# Patient Record
Sex: Female | Born: 1935 | Race: White | Hispanic: No | Marital: Married | Smoking: Never smoker
Health system: Southern US, Community
[De-identification: ages and names within clinical notes are randomized; demographics above are authoritative.]

## PROBLEM LIST (undated history)

## (undated) DIAGNOSIS — E079 Disorder of thyroid, unspecified: Secondary | ICD-10-CM

## (undated) DIAGNOSIS — K219 Gastro-esophageal reflux disease without esophagitis: Secondary | ICD-10-CM

## (undated) DIAGNOSIS — E785 Hyperlipidemia, unspecified: Secondary | ICD-10-CM

## (undated) DIAGNOSIS — I10 Essential (primary) hypertension: Secondary | ICD-10-CM

## (undated) DIAGNOSIS — I2510 Atherosclerotic heart disease of native coronary artery without angina pectoris: Secondary | ICD-10-CM

## (undated) DIAGNOSIS — D649 Anemia, unspecified: Secondary | ICD-10-CM

## (undated) HISTORY — PX: ABDOMINAL HYSTERECTOMY: SHX81

## (undated) HISTORY — PX: TONSILLECTOMY: SUR1361

---

## 2009-01-27 ENCOUNTER — Emergency Department (HOSPITAL_BASED_OUTPATIENT_CLINIC_OR_DEPARTMENT_OTHER): Admission: EM | Admit: 2009-01-27 | Discharge: 2009-01-27 | Payer: Self-pay | Admitting: Emergency Medicine

## 2009-01-27 ENCOUNTER — Ambulatory Visit: Payer: Self-pay | Admitting: Interventional Radiology

## 2010-11-24 LAB — BASIC METABOLIC PANEL
CO2: 28 mEq/L (ref 19–32)
Calcium: 9.5 mg/dL (ref 8.4–10.5)
Creatinine, Ser: 0.7 mg/dL (ref 0.4–1.2)
Glucose, Bld: 115 mg/dL — ABNORMAL HIGH (ref 70–99)

## 2010-11-24 LAB — CBC
Hemoglobin: 12.7 g/dL (ref 12.0–15.0)
MCHC: 34.1 g/dL (ref 30.0–36.0)
MCV: 96.6 fL (ref 78.0–100.0)
RDW: 12.8 % (ref 11.5–15.5)

## 2010-11-24 LAB — DIFFERENTIAL
Basophils Absolute: 0 10*3/uL (ref 0.0–0.1)
Basophils Relative: 0 % (ref 0–1)
Eosinophils Absolute: 0.1 10*3/uL (ref 0.0–0.7)
Monocytes Absolute: 0.3 10*3/uL (ref 0.1–1.0)
Neutro Abs: 4.4 10*3/uL (ref 1.7–7.7)
Neutrophils Relative %: 71 % (ref 43–77)

## 2010-11-24 LAB — POCT CARDIAC MARKERS: CKMB, poc: 1.3 ng/mL (ref 1.0–8.0)

## 2015-03-15 ENCOUNTER — Encounter (HOSPITAL_BASED_OUTPATIENT_CLINIC_OR_DEPARTMENT_OTHER): Payer: Self-pay

## 2015-03-15 ENCOUNTER — Emergency Department (HOSPITAL_BASED_OUTPATIENT_CLINIC_OR_DEPARTMENT_OTHER): Payer: Medicare Other

## 2015-03-15 ENCOUNTER — Emergency Department (HOSPITAL_BASED_OUTPATIENT_CLINIC_OR_DEPARTMENT_OTHER)
Admission: EM | Admit: 2015-03-15 | Discharge: 2015-03-15 | Disposition: A | Discharge status: Home / Self Care | Payer: Medicare Other | Attending: Emergency Medicine | Admitting: Emergency Medicine

## 2015-03-15 DIAGNOSIS — R079 Chest pain, unspecified: Secondary | ICD-10-CM | POA: Diagnosis present

## 2015-03-15 DIAGNOSIS — M549 Dorsalgia, unspecified: Secondary | ICD-10-CM | POA: Diagnosis not present

## 2015-03-15 DIAGNOSIS — Z90710 Acquired absence of both cervix and uterus: Secondary | ICD-10-CM | POA: Insufficient documentation

## 2015-03-15 DIAGNOSIS — Z79899 Other long term (current) drug therapy: Secondary | ICD-10-CM | POA: Diagnosis not present

## 2015-03-15 DIAGNOSIS — R0789 Other chest pain: Secondary | ICD-10-CM | POA: Insufficient documentation

## 2015-03-15 DIAGNOSIS — I10 Essential (primary) hypertension: Secondary | ICD-10-CM

## 2015-03-15 DIAGNOSIS — I2510 Atherosclerotic heart disease of native coronary artery without angina pectoris: Secondary | ICD-10-CM | POA: Diagnosis not present

## 2015-03-15 DIAGNOSIS — R1013 Epigastric pain: Secondary | ICD-10-CM | POA: Diagnosis not present

## 2015-03-15 DIAGNOSIS — E039 Hypothyroidism, unspecified: Secondary | ICD-10-CM | POA: Diagnosis not present

## 2015-03-15 DIAGNOSIS — E785 Hyperlipidemia, unspecified: Secondary | ICD-10-CM | POA: Insufficient documentation

## 2015-03-15 DIAGNOSIS — R1011 Right upper quadrant pain: Secondary | ICD-10-CM | POA: Diagnosis not present

## 2015-03-15 HISTORY — DX: Disorder of thyroid, unspecified: E07.9

## 2015-03-15 HISTORY — DX: Hyperlipidemia, unspecified: E78.5

## 2015-03-15 HISTORY — DX: Essential (primary) hypertension: I10

## 2015-03-15 HISTORY — DX: Atherosclerotic heart disease of native coronary artery without angina pectoris: I25.10

## 2015-03-15 LAB — CBC WITH DIFFERENTIAL/PLATELET
BASOS ABS: 0 10*3/uL (ref 0.0–0.1)
Basophils Relative: 0 % (ref 0–1)
EOS ABS: 0.2 10*3/uL (ref 0.0–0.7)
Eosinophils Relative: 4 % (ref 0–5)
HEMATOCRIT: 37.9 % (ref 36.0–46.0)
Hemoglobin: 12.4 g/dL (ref 12.0–15.0)
LYMPHS ABS: 1.6 10*3/uL (ref 0.7–4.0)
Lymphocytes Relative: 26 % (ref 12–46)
MCH: 32.9 pg (ref 26.0–34.0)
MCHC: 32.7 g/dL (ref 30.0–36.0)
MCV: 100.5 fL — ABNORMAL HIGH (ref 78.0–100.0)
MONO ABS: 0.5 10*3/uL (ref 0.1–1.0)
MONOS PCT: 8 % (ref 3–12)
NEUTROS ABS: 4 10*3/uL (ref 1.7–7.7)
Neutrophils Relative %: 62 % (ref 43–77)
PLATELETS: 270 10*3/uL (ref 150–400)
RBC: 3.77 MIL/uL — AB (ref 3.87–5.11)
RDW: 13.2 % (ref 11.5–15.5)
WBC: 6.3 10*3/uL (ref 4.0–10.5)

## 2015-03-15 LAB — URINALYSIS, ROUTINE W REFLEX MICROSCOPIC
Bilirubin Urine: NEGATIVE
GLUCOSE, UA: NEGATIVE mg/dL
KETONES UR: NEGATIVE mg/dL
NITRITE: NEGATIVE
Protein, ur: NEGATIVE mg/dL
Specific Gravity, Urine: 1.004 — ABNORMAL LOW (ref 1.005–1.030)
UROBILINOGEN UA: 0.2 mg/dL (ref 0.0–1.0)
pH: 6.5 (ref 5.0–8.0)

## 2015-03-15 LAB — TROPONIN I: Troponin I: <0.03 ng/mL (ref <0.031)

## 2015-03-15 LAB — COMPREHENSIVE METABOLIC PANEL
ALBUMIN: 3.9 g/dL (ref 3.5–5.0)
ALT: 25 U/L (ref 14–54)
AST: 40 U/L (ref 15–41)
Alkaline Phosphatase: 122 U/L (ref 38–126)
Anion gap: 5 (ref 5–15)
BUN: 15 mg/dL (ref 6–20)
CALCIUM: 9 mg/dL (ref 8.9–10.3)
CHLORIDE: 106 mmol/L (ref 101–111)
CO2: 26 mmol/L (ref 22–32)
Creatinine, Ser: 0.61 mg/dL (ref 0.44–1.00)
GFR calc Af Amer: >60 mL/min (ref >60)
GFR calc non Af Amer: >60 mL/min (ref >60)
Glucose, Bld: 149 mg/dL — ABNORMAL HIGH (ref 65–99)
Potassium: 4 mmol/L (ref 3.5–5.1)
Sodium: 137 mmol/L (ref 135–145)
TOTAL PROTEIN: 7.9 g/dL (ref 6.5–8.1)
Total Bilirubin: 1.1 mg/dL (ref 0.3–1.2)

## 2015-03-15 LAB — LIPASE, BLOOD: LIPASE: 19 U/L — AB (ref 22–51)

## 2015-03-15 LAB — URINE MICROSCOPIC-ADD ON

## 2015-03-15 MED ORDER — IOHEXOL 350 MG/ML SOLN
100.0000 mL | Freq: Once | INTRAVENOUS | Status: AC | PRN
  Administered 2015-03-15: 100 mL via INTRAVENOUS

## 2015-03-15 NOTE — ED Notes (Signed)
PT reports "stomach pain" but points to her chest, epigastric area, with radiation to back. Reports pain woke her up this morning.

## 2015-03-15 NOTE — ED Provider Notes (Addendum)
CSN: [id number]     Arrival date & time 03/15/15  0901 History   First MD Initiated Contact with Patient 03/15/15 [phone]     Chief Complaint  Patient presents with  . Chest Pain     (Consider location/radiation/quality/duration/timing/severity/associated sxs/prior Treatment) The history is provided by the patient.  Casey Vazquez is a 81 y.o. female hx of HTN, HL, nonobstructive CAD here with chest pain, back pain, ab pain.  She woke up this morning with sudden onset sharp chest pain radiating to her abdomen and back.  Lasted for about an hour and then subsided.  Denies any shortness of breath.  She took her morning blood pressure medicine and then came to the ER.  Denies any fevers or chills or cough.  States that she has history of GERD and this is different.  Patient had 2 angiograms in the past but had no cardiac stents.    Past Medical History  Diagnosis Date  . Hypertension   . Hyperlipidemia   . Coronary artery disease   . Thyroid disease    Past Surgical History  Procedure Laterality Date  . Abdominal hysterectomy    . Tonsillectomy     History reviewed. No pertinent family history. History  Substance Use Topics  . Smoking status: Never Smoker   . Smokeless tobacco: Not on file  . Alcohol Use: No   OB History    No data available     Review of Systems  Cardiovascular: Positive for chest pain.  Gastrointestinal: Positive for abdominal pain.  All other systems reviewed and are negative.     Allergies  Ace inhibitors; Ampicillin; Benicar; Codeine; Evista; Levaquin; and Sulfa antibiotics  Home Medications   Prior to Admission medications   Medication Sig Start Date End Date Taking? Authorizing Provider  carvedilol (COREG) 3.125 MG tablet Take 3.125 mg by mouth 2 (two) times daily with a meal.   Yes Historical Provider, MD  cloNIDine (CATAPRES) 0.2 MG tablet Take 0.2 mg by mouth 2 (two) times daily.   Yes Historical Provider, MD  ezetimibe (ZETIA) 10 MG tablet  Take 10 mg by mouth daily.   Yes Historical Provider, MD  levothyroxine (SYNTHROID, LEVOTHROID) 88 MCG tablet Take 88 mcg by mouth daily before breakfast.   Yes Historical Provider, MD  pantoprazole (PROTONIX) 40 MG tablet Take 40 mg by mouth daily.   Yes Historical Provider, MD   BP 128/54 mmHg  Pulse 55  Temp(Src) 98.5 F (36.9 C) (Oral)  Resp 13  Ht 5\' 3"  (1.6 m)  Wt 115 lb (52.164 kg)  BMI 20.38 kg/m2  SpO2 94% Physical Exam  Constitutional: She is oriented to person, place, and time.  Uncomfortable   HENT:  Head: Normocephalic.  Mouth/Throat: Oropharynx is clear and moist.  Eyes: Conjunctivae are normal. Pupils are equal, round, and reactive to light.  Neck: Normal range of motion. Neck supple.  Cardiovascular: Normal rate, regular rhythm and normal heart sounds.   Pulmonary/Chest: Effort normal and breath sounds normal. No respiratory distress. She has no wheezes. She has no rales.  Abdominal: Soft. Bowel sounds are normal.  + epigastric tenderness and RUQ tenderness   Musculoskeletal: Normal range of motion.  Neurological: She is alert and oriented to person, place, and time.  Skin: Skin is warm and dry.  Psychiatric: She has a normal mood and affect. Her behavior is normal. Judgment and thought content normal.  Nursing note and vitals reviewed.   ED Course  Procedures (including critical care time)  Labs Review Labs Reviewed  CBC WITH DIFFERENTIAL/PLATELET - Abnormal; Notable for the following:    RBC 3.77 (*)    MCV 100.5 (*)    All other components within normal limits  COMPREHENSIVE METABOLIC PANEL - Abnormal; Notable for the following:    Glucose, Bld 149 (*)    All other components within normal limits  LIPASE, BLOOD - Abnormal; Notable for the following:    Lipase 19 (*)    All other components within normal limits  URINALYSIS, ROUTINE W REFLEX MICROSCOPIC (NOT AT [facility]) - Abnormal; Notable for the following:    Specific Gravity, Urine 1.004 (*)    Hgb  urine dipstick TRACE (*)    Leukocytes, UA SMALL (*)    All other components within normal limits  URINE MICROSCOPIC-ADD ON - Abnormal; Notable for the following:    Bacteria, UA FEW (*)    All other components within normal limits  TROPONIN I  TROPONIN I    Imaging Review Ct Angio Chest Aorta W/cm &/or Wo/cm  03/15/2015   CLINICAL DATA:  81 year old female with sudden onset chest back and abdominal pain earlier this morning which has since subsided. Evaluate for aortic dissection.  EXAM: CT ANGIOGRAPHY CHEST, ABDOMEN AND PELVIS  TECHNIQUE: Multidetector CT imaging through the chest, abdomen and pelvis was performed using the standard protocol during bolus administration of intravenous contrast. Multiplanar reconstructed images and MIPs were obtained and reviewed to evaluate the vascular anatomy.  CONTRAST:  OMNIPAQUE IOHEXOL 350 MG/ML SOLN  COMPARISON:  Chest x-ray 01/27/2009  FINDINGS: CTA CHEST FINDINGS  Mediastinum: Unremarkable CT appearance of the thyroid gland. No suspicious mediastinal or hilar adenopathy. No soft tissue mediastinal mass. The thoracic esophagus is unremarkable.  Heart/Vascular: No evidence of high attenuation within the aortic wall on the initial nonenhanced images to suggest acute intramural hematoma. Following administration of intravenous contrast material there is no evidence of acute aortic dissection, aneurysm or penetrating aortic ulcer. The descending thoracic aorta is elongated resulting in a type 3 arch. Scattered atherosclerotic calcifications. No significant stenosis. Normal caliber main and central pulmonary arteries. No central pulmonary embolus. The heart is within normal limits for size. No definite coronary artery calcification within limitations of a non cardiac gated imaging. No pericardial effusion. Review of the MIP images confirms the above findings.  Lungs/Pleura: Biapical pleural parenchymal scarring. No focal airspace consolidation, pulmonary edema,  pleural effusion or pneumothorax. Mild chronic scarring with focal bronchiectasis in the medial aspect of the right middle lobe. Mild focal bronchiectasis with opacification of the bronchioles and minimal tree-in-bud micro nodularity in the posterior and inferior aspect of the right upper lobe. No suspicious pulmonary nodule or mass.  Bones/Soft Tissues: Exaggerated thoracic kyphosis. No acute fracture or aggressive appearing lytic or blastic osseous lesion.  CTA ABDOMEN AND PELVIS FINDINGS  VASCULAR  Aorta: Normal caliber abdominal aorta with scattered atherosclerotic vascular calcifications but no evidence of dissection, aneurysm or other acute abnormality.  Celiac: Widely patent. Conventional hepatic arterial anatomy. No visceral artery aneurysm.  SMA: Widely patent and unremarkable.  Renals: Single dominant renal arteries bilaterally which are both widely patent. No changes of fibromuscular dysplasia.  IMA: Widely patent.  Inflow: Minimally diseased and widely patent.  Proximal Outflow: Visualized portions are unremarkable.  Veins: No focal venous abnormality.  Review of the MIP images confirms the above findings.  NON-VASCULAR  Abdomen: Unremarkable CT appearance of the stomach, duodenum, spleen, adrenal glands and pancreas.  Normal hepatic contour and morphology. No discrete hepatic lesion.  Gallbladder is unremarkable. No intra or extrahepatic biliary ductal dilatation.  Save for a few tiny low-attenuation lesions which are too small to characterize but statistically likely benign renal cysts, unremarkable appearance of the kidneys. No hydronephrosis or enhancing mass.  No focal bowel wall thickening or evidence of bowel obstruction. There are a few scattered colonic diverticula but no significant diverticulosis or evidence of active diverticulitis. No free fluid or suspicious adenopathy.  Pelvis: Surgical changes of prior hysterectomy. The bladder is unremarkable. No free fluid or suspicious adenopathy.   Bones/Soft Tissues: No acute fracture or aggressive appearing lytic or blastic osseous lesion. L1 vertebral body hemangioma incidentally noted.  IMPRESSION: CTA CHEST  1. No evidence of aortic dissection, intramural hematoma or other acute abnormality. 2. Biapical pleural parenchymal scarring. 3. Very mild focal bronchiectasis and tree-in-bud micro nodularity in the inferior lingula, medial right middle lobe and inferolateral right upper lobe. Findings may reflect sequelae of prior infection or a chronic indolent atypical infection such as MAI. CTA ABD/PELVIS  1. No evidence of aortic aneurysm, dissection or other acute vascular abnormality. 2. No acute nonvascular abnormality to explain the patient's clinical symptoms.   Electronically Signed   By: Malachy Moan M.D.   On: 03/15/2015 11:06   Ct Cta Abd/pel W/cm &/or W/o Cm  03/15/2015   CLINICAL DATA:  81 year old female with sudden onset chest back and abdominal pain earlier this morning which has since subsided. Evaluate for aortic dissection.  EXAM: CT ANGIOGRAPHY CHEST, ABDOMEN AND PELVIS  TECHNIQUE: Multidetector CT imaging through the chest, abdomen and pelvis was performed using the standard protocol during bolus administration of intravenous contrast. Multiplanar reconstructed images and MIPs were obtained and reviewed to evaluate the vascular anatomy.  CONTRAST:  OMNIPAQUE IOHEXOL 350 MG/ML SOLN  COMPARISON:  Chest x-ray 01/27/2009  FINDINGS: CTA CHEST FINDINGS  Mediastinum: Unremarkable CT appearance of the thyroid gland. No suspicious mediastinal or hilar adenopathy. No soft tissue mediastinal mass. The thoracic esophagus is unremarkable.  Heart/Vascular: No evidence of high attenuation within the aortic wall on the initial nonenhanced images to suggest acute intramural hematoma. Following administration of intravenous contrast material there is no evidence of acute aortic dissection, aneurysm or penetrating aortic ulcer. The descending  thoracic aorta is elongated resulting in a type 3 arch. Scattered atherosclerotic calcifications. No significant stenosis. Normal caliber main and central pulmonary arteries. No central pulmonary embolus. The heart is within normal limits for size. No definite coronary artery calcification within limitations of a non cardiac gated imaging. No pericardial effusion. Review of the MIP images confirms the above findings.  Lungs/Pleura: Biapical pleural parenchymal scarring. No focal airspace consolidation, pulmonary edema, pleural effusion or pneumothorax. Mild chronic scarring with focal bronchiectasis in the medial aspect of the right middle lobe. Mild focal bronchiectasis with opacification of the bronchioles and minimal tree-in-bud micro nodularity in the posterior and inferior aspect of the right upper lobe. No suspicious pulmonary nodule or mass.  Bones/Soft Tissues: Exaggerated thoracic kyphosis. No acute fracture or aggressive appearing lytic or blastic osseous lesion.  CTA ABDOMEN AND PELVIS FINDINGS  VASCULAR  Aorta: Normal caliber abdominal aorta with scattered atherosclerotic vascular calcifications but no evidence of dissection, aneurysm or other acute abnormality.  Celiac: Widely patent. Conventional hepatic arterial anatomy. No visceral artery aneurysm.  SMA: Widely patent and unremarkable.  Renals: Single dominant renal arteries bilaterally which are both widely patent. No changes of fibromuscular dysplasia.  IMA: Widely patent.  Inflow: Minimally diseased and widely patent.  Proximal Outflow: Visualized  portions are unremarkable.  Veins: No focal venous abnormality.  Review of the MIP images confirms the above findings.  NON-VASCULAR  Abdomen: Unremarkable CT appearance of the stomach, duodenum, spleen, adrenal glands and pancreas.  Normal hepatic contour and morphology. No discrete hepatic lesion. Gallbladder is unremarkable. No intra or extrahepatic biliary ductal dilatation.  Save for a few tiny  low-attenuation lesions which are too small to characterize but statistically likely benign renal cysts, unremarkable appearance of the kidneys. No hydronephrosis or enhancing mass.  No focal bowel wall thickening or evidence of bowel obstruction. There are a few scattered colonic diverticula but no significant diverticulosis or evidence of active diverticulitis. No free fluid or suspicious adenopathy.  Pelvis: Surgical changes of prior hysterectomy. The bladder is unremarkable. No free fluid or suspicious adenopathy.  Bones/Soft Tissues: No acute fracture or aggressive appearing lytic or blastic osseous lesion. L1 vertebral body hemangioma incidentally noted.  IMPRESSION: CTA CHEST  1. No evidence of aortic dissection, intramural hematoma or other acute abnormality. 2. Biapical pleural parenchymal scarring. 3. Very mild focal bronchiectasis and tree-in-bud micro nodularity in the inferior lingula, medial right middle lobe and inferolateral right upper lobe. Findings may reflect sequelae of prior infection or a chronic indolent atypical infection such as MAI. CTA ABD/PELVIS  1. No evidence of aortic aneurysm, dissection or other acute vascular abnormality. 2. No acute nonvascular abnormality to explain the patient's clinical symptoms.   Electronically Signed   By: Malachy Moan M.D.   On: 03/15/2015 11:06   US Abdomen Limited Ruq  03/15/2015   CLINICAL DATA:  81 year old female with acute upper abdominal pain today.  EXAM: US ABDOMEN LIMITED - RIGHT UPPER QUADRANT  COMPARISON:  None.  FINDINGS: Gallbladder:  The gallbladder is unremarkable. There is no evidence of cholelithiasis or acute cholecystitis.  Common bile duct:  Diameter: 3 mm. There is no evidence of intrahepatic or extrahepatic biliary dilatation. The visualized CBD is unremarkable.  Liver:  No focal lesion identified. Within normal limits in parenchymal echogenicity.  There is no evidence of ascites within the right upper abdomen.  IMPRESSION:  Unremarkable right upper quadrant abdominal ultrasound.   Electronically Signed   By: Harmon Pier M.D.   On: 03/15/2015 11:47     EKG Interpretation   Date/Time:  Friday March 15 2015 09:22:33 EDT Ventricular Rate:  61 PR Interval:  168 QRS Duration: 80 QT Interval:  420 QTC Calculation: 422 R Axis:   19 Text Interpretation:  Normal sinus rhythm Septal infarct , age  undetermined Abnormal ECG No significant change since last tracing  Confirmed by Fahad Cisse  MD, Susannah Carbin ([id number]) on 03/15/2015 10:04:30 AM      MDM   Final diagnoses:  RUQ pain    Manessa Buley is a 81 y.o. female here with chest pain, back pain, epigastric pain. Hypertensive on arrival, I was concerned for dissection so will get CT dissection study. Also consider biliary colic vs ACS. Will get labs, CT, RUQ [place], and delta trop. Patient has no cardiac stents   1:16 PM Dissection study showed no aneurysms. Mild bronchiectasis which is likely from previous infection. WBC nl and no cough or fever to suggest pneumonia. Radiology considered MAI but she has no hx of HIV. US showed no gallstones. Labs at baseline. Delta trop neg. Pain free. BP improved and patient took home BP meds this morning. Don't know why she had severe pain. Consider reflux vs gastro. Tolerated lunch. Will dc home.    Richardean Canal, MD  03/15/15 1317  Richardean Canal, MD 03/15/15 1318

## 2015-03-15 NOTE — Discharge Instructions (Signed)
Stay hydrated.   Take tylenol for pain.  See your doctor.   Return to ER if you have worse chest pain, shortness of breath, vomiting, abdominal pain.

## 2015-03-15 NOTE — ED Notes (Signed)
Patient transported to CT 

## 2017-09-05 ENCOUNTER — Encounter (HOSPITAL_BASED_OUTPATIENT_CLINIC_OR_DEPARTMENT_OTHER): Payer: Self-pay | Admitting: Emergency Medicine

## 2017-09-05 ENCOUNTER — Ambulatory Visit (HOSPITAL_BASED_OUTPATIENT_CLINIC_OR_DEPARTMENT_OTHER)
Admit: 2017-09-05 | Discharge: 2017-09-05 | Disposition: A | Discharge status: Home / Self Care | Payer: Medicare Other | Source: Ambulatory Visit | Attending: Emergency Medicine | Admitting: Emergency Medicine

## 2017-09-05 ENCOUNTER — Other Ambulatory Visit: Payer: Self-pay

## 2017-09-05 ENCOUNTER — Encounter (HOSPITAL_BASED_OUTPATIENT_CLINIC_OR_DEPARTMENT_OTHER): Payer: Self-pay | Admitting: *Deleted

## 2017-09-05 ENCOUNTER — Emergency Department (HOSPITAL_BASED_OUTPATIENT_CLINIC_OR_DEPARTMENT_OTHER)
Admission: EM | Admit: 2017-09-05 | Discharge: 2017-09-05 | Disposition: A | Discharge status: Other Acute Inpatient Hospital | Payer: Medicare Other | Attending: Emergency Medicine | Admitting: Emergency Medicine

## 2017-09-05 ENCOUNTER — Emergency Department (HOSPITAL_BASED_OUTPATIENT_CLINIC_OR_DEPARTMENT_OTHER)
Admission: EM | Admit: 2017-09-05 | Discharge: 2017-09-05 | Disposition: A | Discharge status: Home / Self Care | Payer: Medicare Other | Source: Home / Self Care | Attending: Emergency Medicine | Admitting: Emergency Medicine

## 2017-09-05 DIAGNOSIS — K828 Other specified diseases of gallbladder: Secondary | ICD-10-CM | POA: Insufficient documentation

## 2017-09-05 DIAGNOSIS — I2510 Atherosclerotic heart disease of native coronary artery without angina pectoris: Secondary | ICD-10-CM | POA: Insufficient documentation

## 2017-09-05 DIAGNOSIS — Z79899 Other long term (current) drug therapy: Secondary | ICD-10-CM

## 2017-09-05 DIAGNOSIS — R1013 Epigastric pain: Secondary | ICD-10-CM

## 2017-09-05 DIAGNOSIS — I10 Essential (primary) hypertension: Secondary | ICD-10-CM | POA: Insufficient documentation

## 2017-09-05 DIAGNOSIS — K8020 Calculus of gallbladder without cholecystitis without obstruction: Secondary | ICD-10-CM | POA: Insufficient documentation

## 2017-09-05 HISTORY — DX: Gastro-esophageal reflux disease without esophagitis: K21.9

## 2017-09-05 HISTORY — DX: Anemia, unspecified: D64.9

## 2017-09-05 LAB — COMPREHENSIVE METABOLIC PANEL
ALBUMIN: 3.5 g/dL (ref 3.5–5.0)
ALBUMIN: 3.8 g/dL (ref 3.5–5.0)
ALK PHOS: 356 U/L — AB (ref 38–126)
ALT: 49 U/L (ref 14–54)
ALT: 49 U/L (ref 14–54)
ANION GAP: 8 (ref 5–15)
AST: 108 U/L — ABNORMAL HIGH (ref 15–41)
AST: 71 U/L — AB (ref 15–41)
Alkaline Phosphatase: 328 U/L — ABNORMAL HIGH (ref 38–126)
Anion gap: 7 (ref 5–15)
BILIRUBIN TOTAL: 0.7 mg/dL (ref 0.3–1.2)
BUN: 16 mg/dL (ref 6–20)
BUN: 13 mg/dL (ref 6–20)
CALCIUM: 9.2 mg/dL (ref 8.9–10.3)
CO2: 26 mmol/L (ref 22–32)
CO2: 28 mmol/L (ref 22–32)
CREATININE: 0.53 mg/dL (ref 0.44–1.00)
Calcium: 8.9 mg/dL (ref 8.9–10.3)
Chloride: 102 mmol/L (ref 101–111)
Chloride: 102 mmol/L (ref 101–111)
Creatinine, Ser: 0.71 mg/dL (ref 0.44–1.00)
GFR calc Af Amer: >60 mL/min (ref >60)
GFR calc non Af Amer: >60 mL/min (ref >60)
GLUCOSE: 105 mg/dL — AB (ref 65–99)
Glucose, Bld: 141 mg/dL — ABNORMAL HIGH (ref 65–99)
POTASSIUM: 3.7 mmol/L (ref 3.5–5.1)
Potassium: 4 mmol/L (ref 3.5–5.1)
SODIUM: 137 mmol/L (ref 135–145)
Sodium: 136 mmol/L (ref 135–145)
TOTAL PROTEIN: 8.6 g/dL — AB (ref 6.5–8.1)
Total Bilirubin: 0.7 mg/dL (ref 0.3–1.2)
Total Protein: 8 g/dL (ref 6.5–8.1)

## 2017-09-05 LAB — CBC WITH DIFFERENTIAL/PLATELET
BASOS ABS: 0 10*3/uL (ref 0.0–0.1)
BASOS PCT: 1 %
BASOS PCT: 1 %
Basophils Absolute: 0.1 10*3/uL (ref 0.0–0.1)
EOS ABS: 0.4 10*3/uL (ref 0.0–0.7)
EOS PCT: 4 %
Eosinophils Absolute: 0.9 10*3/uL — ABNORMAL HIGH (ref 0.0–0.7)
Eosinophils Relative: 9 %
HEMATOCRIT: 35.6 % — AB (ref 36.0–46.0)
HEMATOCRIT: 38.5 % (ref 36.0–46.0)
Hemoglobin: 11.8 g/dL — ABNORMAL LOW (ref 12.0–15.0)
Hemoglobin: 12.6 g/dL (ref 12.0–15.0)
Lymphocytes Relative: 18 %
Lymphocytes Relative: 27 %
Lymphs Abs: 1.8 10*3/uL (ref 0.7–4.0)
Lymphs Abs: 2.3 10*3/uL (ref 0.7–4.0)
MCH: 31 pg (ref 26.0–34.0)
MCH: 30.7 pg (ref 26.0–34.0)
MCHC: 33.1 g/dL (ref 30.0–36.0)
MCHC: 32.7 g/dL (ref 30.0–36.0)
MCV: 93.4 fL (ref 78.0–100.0)
MCV: 93.9 fL (ref 78.0–100.0)
MONO ABS: 0.8 10*3/uL (ref 0.1–1.0)
MONO ABS: 0.6 10*3/uL (ref 0.1–1.0)
MONOS PCT: 8 %
MONOS PCT: 7 %
NEUTROS ABS: 6.1 10*3/uL (ref 1.7–7.7)
NEUTROS ABS: 5.3 10*3/uL (ref 1.7–7.7)
Neutrophils Relative %: 64 %
Neutrophils Relative %: 61 %
PLATELETS: 336 10*3/uL (ref 150–400)
Platelets: 316 10*3/uL (ref 150–400)
RBC: 3.81 MIL/uL — ABNORMAL LOW (ref 3.87–5.11)
RBC: 4.1 MIL/uL (ref 3.87–5.11)
RDW: 13.7 % (ref 11.5–15.5)
RDW: 13.8 % (ref 11.5–15.5)
WBC: 9.5 10*3/uL (ref 4.0–10.5)
WBC: 8.5 10*3/uL (ref 4.0–10.5)

## 2017-09-05 LAB — LIPASE, BLOOD
LIPASE: 31 U/L (ref 11–51)
Lipase: 26 U/L (ref 11–51)

## 2017-09-05 MED ORDER — PROMETHAZINE HCL 25 MG PO TABS: 12.5000 mg | ORAL_TABLET | Freq: Four times a day (QID) | ORAL | 0 refills | Status: DC | PRN

## 2017-09-05 MED ORDER — DEXTROSE 5 % IV SOLN
2.0000 g | Freq: Once | INTRAVENOUS | Status: AC
  Administered 2017-09-05: 2 g via INTRAVENOUS
  Filled 2017-09-05: qty 2

## 2017-09-05 MED ORDER — ONDANSETRON HCL 4 MG/2ML IJ SOLN
4.0000 mg | Freq: Once | INTRAMUSCULAR | Status: AC
  Administered 2017-09-05: 4 mg via INTRAVENOUS
  Filled 2017-09-05: qty 2

## 2017-09-05 MED ORDER — GI COCKTAIL ~~LOC~~
30.0000 mL | Freq: Once | ORAL | Status: AC
  Administered 2017-09-05: 30 mL via ORAL
  Filled 2017-09-05: qty 30

## 2017-09-05 NOTE — ED Notes (Signed)
Called [facility] and spoke to [place], regarding transport to [facility]

## 2017-09-05 NOTE — ED Provider Notes (Signed)
[facility] EMERGENCY DEPARTMENT Provider Note   CSN: [id number] Arrival date & time: 09/05/17  1057     History   Chief Complaint Chief Complaint  Patient presents with  . Abdominal Pain    HPI Charon Smedberg is a 84 y.o. female.  HPI  84 year old female with CAD, HTN, HLD presents with epigastric pain. On and off this week. Seen in [facility] ED last night around 3 AM. Discharged and got U/S this morning. US shows possible early cholecystitis. Pain is better, but still present. No fevers. Has had some nausea.   Past Medical History:  Diagnosis Date  . Acid reflux   . Anemia   . Coronary artery disease   . Hyperlipidemia   . Hypertension   . Thyroid disease     There are no active problems to display for this patient.   Past Surgical History:  Procedure Laterality Date  . ABDOMINAL HYSTERECTOMY    . TONSILLECTOMY      OB History    No data available       Home Medications    Prior to Admission medications   Medication Sig Start Date End Date Taking? Authorizing Provider  amLODipine (NORVASC) 5 MG tablet Take 5 mg by mouth daily.    [provider]  carvedilol (COREG) 3.125 MG tablet Take 3.125 mg by mouth 2 (two) times daily with a meal.    [provider]  cloNIDine (CATAPRES) 0.2 MG tablet Take 0.2 mg by mouth 2 (two) times daily.    [provider]  Cyanocobalamin (B-12) 1000 MCG/ML KIT Inject as directed.    [provider]  ezetimibe (ZETIA) 10 MG tablet Take 10 mg by mouth daily.    [provider]  levothyroxine (SYNTHROID, LEVOTHROID) 75 MCG tablet Take 75 mcg by mouth daily before breakfast.    [provider]  levothyroxine (SYNTHROID, LEVOTHROID) 88 MCG tablet Take 88 mcg by mouth daily before breakfast.    [provider]  pantoprazole (PROTONIX) 40 MG tablet Take 40 mg by mouth daily.    [provider]  promethazine (PHENERGAN) 25 MG tablet Take 0.5 tablets (12.5 mg  total) by mouth every 6 (six) hours as needed for nausea or vomiting. 09/05/17   Horton, Barbette Hair, MD    Family History No family history on file.  Social History Social History   Tobacco Use  . Smoking status: Never Smoker  . Smokeless tobacco: Never Used  Substance Use Topics  . Alcohol use: No  . Drug use: No     Allergies   Ace inhibitors; Ampicillin; Benicar [olmesartan]; Codeine; Evista [raloxifene]; Levaquin [levofloxacin in d5w]; and Sulfa antibiotics   Review of Systems Review of Systems  Constitutional: Negative for fever.  Respiratory: Negative for shortness of breath.   Cardiovascular: Negative for chest pain.  Gastrointestinal: Positive for abdominal pain and nausea. Negative for vomiting.  All other systems reviewed and are negative.    Physical Exam Updated Vital Signs BP (!) 141/59 (BP Location: Right Arm)   Pulse (!) 52   Temp 98.2 F (36.8 C) (Oral)   Resp 15   Ht 5' 3" (1.6 m)   Wt 46.7 kg (103 lb)   SpO2 97%   BMI 18.25 kg/m   Physical Exam  Constitutional: She is oriented to person, place, and time. She appears well-developed and well-nourished.  Non-toxic appearance. She does not appear ill. No distress.  HENT:  Head: Normocephalic and atraumatic.  Right  Ear: External ear normal.  Left Ear: External ear normal.  Nose: Nose normal.  Eyes: Right eye exhibits no discharge. Left eye exhibits no discharge.  Cardiovascular: Normal rate, regular rhythm and normal heart sounds.  Pulmonary/Chest: Effort normal and breath sounds normal.  Abdominal: Soft. There is tenderness in the right upper quadrant and epigastric area.  Neurological: She is alert and oriented to person, place, and time.  Skin: Skin is warm and dry.  Nursing note and vitals reviewed.    ED Treatments / Results  Labs (all labs ordered are listed, but only abnormal results are displayed) Labs Reviewed  COMPREHENSIVE METABOLIC PANEL - Abnormal; Notable for the following  components:      Result Value   Glucose, Bld 105 (*)    Total Protein 8.6 (*)    AST 71 (*)    Alkaline Phosphatase 356 (*)    All other components within normal limits  CBC WITH DIFFERENTIAL/PLATELET  LIPASE, BLOOD    EKG  EKG Interpretation None       Radiology US Abdomen Limited Ruq/gall Bladder  Result Date: 09/05/2017 CLINICAL DATA:  Epigastric region pain and nausea EXAM: ULTRASOUND ABDOMEN LIMITED RIGHT UPPER QUADRANT COMPARISON:  March 15, 2015 FINDINGS: Gallbladder: Gallbladder appears mildly distended. Gallbladder wall thickness is borderline increased. There is mild sludge in gallbladder, but no gallstones are seen. There is no gallbladder wall edema or pericholecystic fluid. Patient is focally tender over the gallbladder. Common bile duct: Diameter: 6 mm. No intrahepatic or extrahepatic biliary duct dilatation evident. Liver: No focal lesion identified. Within normal limits in parenchymal echogenicity. Portal vein is patent on color Doppler imaging with normal direction of blood flow towards the liver. IMPRESSION: Gallbladder mildly distended with wall thickness borderline increased. Mild sludge in gallbladder but no gallstones seen. Patient is focally tender over the gallbladder. These findings raise concern for potential early acute cholecystitis. In this regard, it may be prudent to consider nuclear medicine hepatobiliary imaging study to assess for cystic duct patency. Study otherwise unremarkable. Electronically Signed   By: Lowella Grip III M.D.   On: 09/05/2017 09:48    Procedures Procedures (including critical care time)  Medications Ordered in ED Medications  cefTRIAXone (ROCEPHIN) 2 g in dextrose 5 % 50 mL IVPB (not administered)     Initial Impression / Assessment and Plan / ED Course  I have reviewed the triage vital signs and the nursing notes.  Pertinent labs & imaging results that were available during my care of the patient were reviewed by me and  considered in my medical decision making (see chart for details).     Given ultrasound findings, I discussed with patient and given some concern for cholecystitis, especially with continued pain, she will need a HIDA scan.  She prefers to go to [facility] given her husband is admitted there.  I discussed with hospitalist, Dr. Doyne Keel,  who accepts in transfer and admission to [facility].  She will be given a dose of antibiotics for possible cholecystitis. Has remained stable in ED. Kept NPO. Declines pain meds at this time.  Final Clinical Impressions(s) / ED Diagnoses   Final diagnoses:  Symptomatic cholelithiasis    ED Discharge Orders    None       Sherwood Gambler, MD 09/05/17 1415

## 2017-09-05 NOTE — ED Triage Notes (Signed)
C/o abd pain that started around 2330. States she had eaten a greasy meal tonight. Also states she had a similar episode earlier in the week which was also after a greasy meal. Took maalox tonight without relief. C/o nausea. Denies vomiting. Denies fevers. Describes as a burning/gnawing type pain that is constant.

## 2017-09-05 NOTE — ED Provider Notes (Signed)
[facility] DEPART[facility] Provider Note   CSN: [id number] Arrival date & time: 09/05/17  0242     History   Chief Complaint Chief Complaint  Patient presents with  . Abdominal Pain    HPI Casey Vazquez is a 84 y.o. female.  HPI  This is an 84 year old female with history of reflux, coronary artery disease, hypertension, hyperlipidemia who presents with epigastric pain.  Patient reports onset of epigastric burning pain tonight around 11:30 PM.  She states that she ate a greasy meal.  She has been eating more fast food because her husband is in the hospital.  She took Maalox with minimal relief.  She takes daily Protonix.  She has had similar pain in the past that she has related to reflux.  She had one discrete episode earlier this week that self resolved.  She describes the pain as burning/gnawing.  Currently she rates her pain at 9 out of 10.  She reports nausea without vomiting.  She had a normal bowel movement.  She denies any chest pain, shortness of breath, fevers.  Nothing seems to make the pain better or worse.  Past Medical History:  Diagnosis Date  . Acid reflux   . Anemia   . Coronary artery disease   . Hyperlipidemia   . Hypertension   . Thyroid disease     There are no active problems to display for this patient.   Past Surgical History:  Procedure Laterality Date  . ABDOMINAL HYSTERECTOMY    . TONSILLECTOMY      OB History    No data available       Home Medications    Prior to Admission medications   Medication Sig Start Date End Date Taking? Authorizing Provider  amLODipine (NORVASC) 5 MG tablet Take 5 mg by mouth daily.   Yes [provider]  Cyanocobalamin (B-12) 1000 MCG/ML KIT Inject as directed.   Yes [provider]  levothyroxine (SYNTHROID, LEVOTHROID) 75 MCG tablet Take 75 mcg by mouth daily before breakfast.   Yes [provider]  carvedilol (COREG) 3.125 MG tablet Take 3.125 mg by mouth 2 (two)  times daily with a meal.    [provider]  cloNIDine (CATAPRES) 0.2 MG tablet Take 0.2 mg by mouth 2 (two) times daily.    [provider]  ezetimibe (ZETIA) 10 MG tablet Take 10 mg by mouth daily.    [provider]  levothyroxine (SYNTHROID, LEVOTHROID) 88 MCG tablet Take 88 mcg by mouth daily before breakfast.    [provider]  pantoprazole (PROTONIX) 40 MG tablet Take 40 mg by mouth daily.    [provider]    Family History No family history on file.  Social History Social History   Tobacco Use  . Smoking status: Never Smoker  . Smokeless tobacco: Never Used  Substance Use Topics  . Alcohol use: No  . Drug use: No     Allergies   Ace inhibitors; Ampicillin; Benicar [olmesartan]; Codeine; Evista [raloxifene]; Levaquin [levofloxacin in d5w]; and Sulfa antibiotics   Review of Systems Review of Systems  Constitutional: Negative for fever.  Respiratory: Negative for shortness of breath.   Cardiovascular: Negative for chest pain.  Gastrointestinal: Positive for abdominal pain and nausea. Negative for constipation, diarrhea and vomiting.  Genitourinary: Negative for dysuria.  Musculoskeletal: Negative for back pain.  All other systems reviewed and are negative.    Physical Exam Updated Vital Signs BP (!) 154/68   Pulse 65  Temp 98.3 F (36.8 C) (Oral)   Resp 17   Ht '5\' 3"'  (1.6 m)   Wt 46.7 kg (103 lb)   SpO2 98%   BMI 18.25 kg/m   Physical Exam  Constitutional: She is oriented to person, place, and time.  Elderly, thin, no acute distress  HENT:  Head: Normocephalic and atraumatic.  Neck: Neck supple.  Cardiovascular: Normal rate, regular rhythm and normal heart sounds.  Pulmonary/Chest: Effort normal. No respiratory distress. She has no wheezes.  Abdominal: Soft. Normal appearance and bowel sounds are normal. There is tenderness in the right upper quadrant and epigastric area. There is no rebound and no  guarding.  Right upper quadrant and epigastric tenderness to palpation, no rebound or guarding  Neurological: She is alert and oriented to person, place, and time.  Skin: Skin is warm and dry.  Psychiatric: She has a normal mood and affect.  Nursing note and vitals reviewed.    ED Treatments / Results  Labs (all labs ordered are listed, but only abnormal results are displayed) Labs Reviewed  CBC WITH DIFFERENTIAL/PLATELET - Abnormal; Notable for the following components:      Result Value   RBC 3.81 (*)    Hemoglobin 11.8 (*)    HCT 35.6 (*)    Eosinophils Absolute 0.9 (*)    All other components within normal limits  COMPREHENSIVE METABOLIC PANEL - Abnormal; Notable for the following components:   Glucose, Bld 141 (*)    AST 108 (*)    Alkaline Phosphatase 328 (*)    All other components within normal limits  LIPASE, BLOOD    EKG  EKG Interpretation  Date/Time:  Sunday September 05 2017 03:17:00 EST Ventricular Rate:  62 PR Interval:    QRS Duration: 97 QT Interval:  435 QTC Calculation: 442 R Axis:   -11 Text Interpretation:  Sinus rhythm Probable left ventricular hypertrophy Anterior Q waves, possibly due to LVH No significant change since last tracing Confirmed by Thayer Jew [phone]) on 09/05/2017 3:54:02 AM       Radiology No results found.  Procedures Procedures (including critical care time)  Medications Ordered in ED Medications  ondansetron (ZOFRAN) injection 4 mg (4 mg Intravenous Given 09/05/17 0325)  gi cocktail (Maalox,Lidocaine,Donnatal) (30 mLs Oral Given 09/05/17 0326)     Initial Impression / Assessment and Plan / ED Course  I have reviewed the triage vital signs and the nursing notes.  Pertinent labs & imaging results that were available during my care of the patient were reviewed by me and considered in my medical decision making (see chart for details).     Presents with epigastric pain.  She relates this to her known reflux.   However, not improved with Maalox.  She is overall vital signs are reassuring.  She has some epigastric and right upper quadrant tenderness to palpation but no signs of peritonitis.  Considerations include GERD, atypical ACS, pancreatitis, cholecystitis.  Basic lab work obtained.  No leukocytosis.  LFTs normal.  Lipase is normal.  EKG shows no evidence of ischemia and patient reports recent negative stress imaging.  No further workup initiated.  Patient was given a GI cocktail.  On recheck, she reports her symptoms have much improved.  Currently her pain is 4 out of 10.  I discussed with her options regarding imaging.  I do feel she needs to have her gallbladder imaged given recurrent nature of her discomfort and tenderness on exam.  However, CT is my only imaging modality  at this time.  Given that her labs are reassuring and she is improving, feel it is reasonable for her to return for ultrasound later today.  However, I have offered CT scan for more comprehensive evaluation if she does not wish to return.  Patient opted for ultrasound.  She is able to tolerate fluids.  She does report some persistent nausea but no recurrent pain with fluid intake.  Recommend continue Protonix.  GI follow-up.  Return for ultrasound later today.  Patient and her son were given strict return precautions.  After history, exam, and medical workup I feel the patient has been appropriately medically screened and is safe for discharge home. Pertinent diagnoses were discussed with the patient. Patient was given return precautions.   Final Clinical Impressions(s) / ED Diagnoses   Final diagnoses:  Epigastric pain    ED Discharge Orders        Ordered    US Abdomen Limited RUQ/Gall Bladder     09/05/17 0432       Merryl Hacker, MD 09/05/17 [phone]

## 2017-09-05 NOTE — Discharge Instructions (Signed)
You were seen today for upper abdominal pain.  This may be related to your known reflux.  However, you need to have an ultrasound of your gallbladder.  Additionally you may need to have endoscopy by gastroenterology.  Follow-up for ultrasound later today.  Take your Protonix as prescribed.  If you develop any new or worsening symptoms you should be reevaluated.

## 2017-09-05 NOTE — ED Triage Notes (Signed)
Pt c/o abd pain since yesterday. Returned this morning for US and found to have sludge in her gallbladder.

## 2019-12-01 ENCOUNTER — Encounter (HOSPITAL_BASED_OUTPATIENT_CLINIC_OR_DEPARTMENT_OTHER): Payer: Self-pay | Admitting: Emergency Medicine

## 2019-12-01 ENCOUNTER — Other Ambulatory Visit: Payer: Self-pay

## 2019-12-01 ENCOUNTER — Emergency Department (HOSPITAL_BASED_OUTPATIENT_CLINIC_OR_DEPARTMENT_OTHER): Payer: Medicare Other

## 2019-12-01 ENCOUNTER — Emergency Department (HOSPITAL_BASED_OUTPATIENT_CLINIC_OR_DEPARTMENT_OTHER)
Admission: EM | Admit: 2019-12-01 | Discharge: 2019-12-01 | Disposition: A | Discharge status: Home / Self Care | Payer: Medicare Other | Attending: Emergency Medicine | Admitting: Emergency Medicine

## 2019-12-01 DIAGNOSIS — W228XXA Striking against or struck by other objects, initial encounter: Secondary | ICD-10-CM | POA: Insufficient documentation

## 2019-12-01 DIAGNOSIS — M5489 Other dorsalgia: Secondary | ICD-10-CM | POA: Diagnosis present

## 2019-12-01 DIAGNOSIS — I10 Essential (primary) hypertension: Secondary | ICD-10-CM | POA: Insufficient documentation

## 2019-12-01 DIAGNOSIS — M545 Low back pain, unspecified: Secondary | ICD-10-CM

## 2019-12-01 DIAGNOSIS — Y999 Unspecified external cause status: Secondary | ICD-10-CM | POA: Diagnosis not present

## 2019-12-01 DIAGNOSIS — Y9389 Activity, other specified: Secondary | ICD-10-CM | POA: Diagnosis not present

## 2019-12-01 DIAGNOSIS — Z79899 Other long term (current) drug therapy: Secondary | ICD-10-CM | POA: Diagnosis not present

## 2019-12-01 DIAGNOSIS — E039 Hypothyroidism, unspecified: Secondary | ICD-10-CM | POA: Diagnosis not present

## 2019-12-01 DIAGNOSIS — N3001 Acute cystitis with hematuria: Secondary | ICD-10-CM | POA: Diagnosis not present

## 2019-12-01 DIAGNOSIS — R4182 Altered mental status, unspecified: Secondary | ICD-10-CM | POA: Diagnosis not present

## 2019-12-01 DIAGNOSIS — S32110A Nondisplaced Zone I fracture of sacrum, initial encounter for closed fracture: Secondary | ICD-10-CM | POA: Insufficient documentation

## 2019-12-01 DIAGNOSIS — Y9289 Other specified places as the place of occurrence of the external cause: Secondary | ICD-10-CM | POA: Insufficient documentation

## 2019-12-01 LAB — CBC WITH DIFFERENTIAL/PLATELET
Abs Immature Granulocytes: 0.08 10*3/uL — ABNORMAL HIGH (ref 0.00–0.07)
Basophils Absolute: 0.1 10*3/uL (ref 0.0–0.1)
Basophils Relative: 1 %
Eosinophils Absolute: 0.1 10*3/uL (ref 0.0–0.5)
Eosinophils Relative: 1 %
HCT: 36.6 % (ref 36.0–46.0)
Hemoglobin: 11.6 g/dL — ABNORMAL LOW (ref 12.0–15.0)
Immature Granulocytes: 1 %
Lymphocytes Relative: 37 %
Lymphs Abs: 3.7 10*3/uL (ref 0.7–4.0)
MCH: 29.7 pg (ref 26.0–34.0)
MCHC: 31.7 g/dL (ref 30.0–36.0)
MCV: 93.8 fL (ref 80.0–100.0)
Monocytes Absolute: 0.7 10*3/uL (ref 0.1–1.0)
Monocytes Relative: 7 %
Neutro Abs: 5.3 10*3/uL (ref 1.7–7.7)
Neutrophils Relative %: 53 %
Platelets: 473 10*3/uL — ABNORMAL HIGH (ref 150–400)
RBC: 3.9 MIL/uL (ref 3.87–5.11)
RDW: 14.3 % (ref 11.5–15.5)
WBC: 9.9 10*3/uL (ref 4.0–10.5)
nRBC: 0 % (ref 0.0–0.2)

## 2019-12-01 LAB — URINALYSIS, ROUTINE W REFLEX MICROSCOPIC
Bilirubin Urine: NEGATIVE
Glucose, UA: NEGATIVE mg/dL
Ketones, ur: NEGATIVE mg/dL
Nitrite: NEGATIVE
Protein, ur: NEGATIVE mg/dL
Specific Gravity, Urine: >1.03 — ABNORMAL HIGH (ref 1.005–1.030)
pH: 6 (ref 5.0–8.0)

## 2019-12-01 LAB — COMPREHENSIVE METABOLIC PANEL
ALT: 21 U/L (ref 0–44)
AST: 37 U/L (ref 15–41)
Albumin: 3.5 g/dL (ref 3.5–5.0)
Alkaline Phosphatase: 142 U/L — ABNORMAL HIGH (ref 38–126)
Anion gap: 10 (ref 5–15)
BUN: 18 mg/dL (ref 8–23)
CO2: 26 mmol/L (ref 22–32)
Calcium: 9 mg/dL (ref 8.9–10.3)
Chloride: 101 mmol/L (ref 98–111)
Creatinine, Ser: 0.59 mg/dL (ref 0.44–1.00)
GFR calc Af Amer: >60 mL/min (ref >60)
GFR calc non Af Amer: >60 mL/min (ref >60)
Glucose, Bld: 217 mg/dL — ABNORMAL HIGH (ref 70–99)
Potassium: 4 mmol/L (ref 3.5–5.1)
Sodium: 137 mmol/L (ref 135–145)
Total Bilirubin: 0.6 mg/dL (ref 0.3–1.2)
Total Protein: 7.5 g/dL (ref 6.5–8.1)

## 2019-12-01 LAB — URINALYSIS, MICROSCOPIC (REFLEX)

## 2019-12-01 LAB — TROPONIN I (HIGH SENSITIVITY): Troponin I (High Sensitivity): 3 ng/L (ref <18)

## 2019-12-01 MED ORDER — LIDOCAINE 5 % EX PTCH: 1.0000 | MEDICATED_PATCH | CUTANEOUS | 0 refills | Status: AC

## 2019-12-01 MED ORDER — CEPHALEXIN 500 MG PO CAPS: 500.0000 mg | ORAL_CAPSULE | Freq: Two times a day (BID) | ORAL | 0 refills | Status: AC

## 2019-12-01 MED ORDER — ONDANSETRON 4 MG PO TBDP
4.0000 mg | ORAL_TABLET | Freq: Once | ORAL | Status: AC
  Administered 2019-12-01: 10:00:00 4 mg via ORAL
  Filled 2019-12-01: qty 1

## 2019-12-01 MED ORDER — ONDANSETRON HCL 4 MG/2ML IJ SOLN
4.0000 mg | Freq: Once | INTRAMUSCULAR | Status: AC
  Administered 2019-12-01: 11:00:00 4 mg via INTRAVENOUS
  Filled 2019-12-01: qty 2

## 2019-12-01 MED ORDER — PROMETHAZINE HCL 25 MG PO TABS
12.5000 mg | ORAL_TABLET | Freq: Once | ORAL | Status: AC
  Administered 2019-12-01: 12.5 mg via ORAL
  Filled 2019-12-01: qty 1

## 2019-12-01 NOTE — ED Notes (Signed)
C/o low back pain x 3 weeks  Had labs and xray done Monday but is no better

## 2019-12-01 NOTE — ED Triage Notes (Signed)
Pt continues to have lower back pain.  Pt states the pain hasn't changed but is not improving.  Able to void and have BM.

## 2019-12-01 NOTE — Discharge Instructions (Signed)
Your CT scan showed a bilateral fracture of your sacral bone likely from the fall 3-4 weeks ago. I have placed an ambulatory referral to physical therapy and it sounds as if your PCP is working on physical therapy in the home as well.  Please follow up with your PCP regarding your ED visit today. Weight bare as tolerated and take your pain medication as needed at home.  Your urine did appear infectious today - please pick up antibiotics and take as prescribed. We have sent the urine for culture and will call in 2-3 days IF the antibiotic needs to be changed Return to the ED IMMEDIATELY for any worsening symptoms including worsening back pain, inability to ambulate at home, fevers > 100.4, abdominal pain, or any other concerning symptoms

## 2019-12-01 NOTE — ED Provider Notes (Signed)
[facility] EMERGENCY DEPARTMENT Provider Note   CSN: [id number] Arrival date & time: 12/01/19  1505     History Chief Complaint  Patient presents with  . Back Pain    Casey Vazquez is a 86 y.o. female with PMHx HTN, HLD, CAD, anemia, hypothyroidism who presents to the ED with family member with complaint of gradual onset, constant, worsening, lower back pain/bilateral hip pain s/p mechanical fall that occurred 3-4 weeks ago. Per family member pt was getting out of the car when the door swung back and hit her, causing her to fall and land directly onto her back. She did not have immediate pain to her back however has slowly worsened since then. Pt saw her PCP on 04/12 for same; had xrays done of bilateral hips and L spine without acute findings. She was discharged home with Tramadol and a consult has been placed for home PT. Daughter reports that pt has slowly declined and unable to properly walk with her walker currently due to the amount of pain. Daughter also reports pt seems more "confused" than normal over the past several days. Pt had labwork done at PCP's 4 days ago including a CMP, TSH, T3, and B12. No CBC or U/A was collected. Family member denies any increased urination or dysuria recently. Pt has been nauseated as of late which they attributed to the pain she is in as well as not wanting to eat as much lately. Denies fevers, chills, speech changes, facial droop, unilateral weakness or numbness, chest pain, SOB, abdominal pain, or any other associated symptoms.   The history is provided by the patient, a relative and medical records.       Past Medical History:  Diagnosis Date  . Acid reflux   . Anemia   . Coronary artery disease   . Hyperlipidemia   . Hypertension   . Thyroid disease     There are no problems to display for this patient.   Past Surgical History:  Procedure Laterality Date  . ABDOMINAL HYSTERECTOMY    . TONSILLECTOMY       OB History   No  obstetric history on file.     History reviewed. No pertinent family history.  Social History   Tobacco Use  . Smoking status: Never Smoker  . Smokeless tobacco: Never Used  Substance Use Topics  . Alcohol use: No  . Drug use: No    Home Medications Prior to Admission medications   Medication Sig Start Date End Date Taking? Authorizing Provider  amLODipine (NORVASC) 5 MG tablet TAKE 1 TABLET BY MOUTH EVERY DAY FOR BLOOD PRESSURE 03/29/19  Yes [provider]  baclofen (LIORESAL) 10 MG tablet Take by mouth. 11/27/19  Yes [provider]  carvedilol (COREG) 3.125 MG tablet Take by mouth. 09/05/15  Yes [provider]  glucose blood (ONETOUCH ULTRA) test strip Use to check blood sugars once daily. Dx:E11.9 10/20/19  Yes [provider]  LORazepam (ATIVAN) 0.5 MG tablet Take 1/2 tablet (0.67m) twice daily as needed for anxiety 10/13/19  Yes [provider]  predniSONE (DELTASONE) 5 MG tablet Take 6 pills (356m daily for one week, then taper as directed by your provider. 10/20/19  Yes [provider]  traMADol (ULTRAM) 50 MG tablet Take by mouth. 11/28/19 12/03/19 Yes [provider]  cephALEXin (KEFLEX) 500 MG capsule Take 1 capsule (500 mg total) by mouth 2 (two) times daily for 5 days. 12/01/19 12/06/19  VeEustaquio MaizePA-C  cloNIDine (CATAPRES)  0.2 MG tablet Take 0.2 mg by mouth 2 (two) times daily.    [provider]  Cyanocobalamin (B-12) 1000 MCG/ML KIT Inject as directed.    [provider]  ezetimibe (ZETIA) 10 MG tablet Take 10 mg by mouth daily.    [provider]  levothyroxine (SYNTHROID, LEVOTHROID) 88 MCG tablet Take 88 mcg by mouth daily before breakfast.    [provider]    Allergies    Ace inhibitors, Ampicillin, Benicar [olmesartan], Codeine, Evista [raloxifene], Levaquin [levofloxacin in d5w], and Sulfa antibiotics  Review of Systems   Review of Systems  Constitutional:  Negative for chills and fever.  Eyes: Negative for visual disturbance.  Respiratory: Negative for cough and shortness of breath.   Cardiovascular: Negative for chest pain.  Gastrointestinal: Positive for nausea. Negative for abdominal pain, constipation, diarrhea and vomiting.  Genitourinary: Negative for difficulty urinating, dysuria, flank pain and frequency.  Musculoskeletal: Positive for back pain.  Neurological: Negative for dizziness, syncope, speech difficulty, weakness, light-headedness and numbness.  All other systems reviewed and are negative.   Physical Exam Updated Vital Signs BP (!) 182/74 (BP Location: Left Arm)   Pulse 65   Temp 98.1 F (36.7 C) (Oral)   Resp 12   Ht _0  (1.6 m)   Wt 44.9 kg   SpO2 95%   BMI 17.54 kg/m   Physical Exam Vitals and nursing note reviewed.  Constitutional:      Appearance: She is not ill-appearing or diaphoretic.     Comments: Frail elderly female  HENT:     Head: Normocephalic and atraumatic.  Eyes:     Extraocular Movements: Extraocular movements intact.     Conjunctiva/sclera: Conjunctivae normal.     Pupils: Pupils are equal, round, and reactive to light.  Cardiovascular:     Rate and Rhythm: Normal rate and regular rhythm.     Pulses: Normal pulses.  Pulmonary:     Effort: Pulmonary effort is normal.     Breath sounds: Normal breath sounds. No wheezing, rhonchi or rales.  Abdominal:     Palpations: Abdomen is soft.     Tenderness: There is no abdominal tenderness. There is no right CVA tenderness, left CVA tenderness, guarding or rebound.  Musculoskeletal:     Cervical back: Neck supple.     Comments: + L midline spinal TTP as well as bilateral paralumbar spinal musculature TTP and bilateral hip TTP. No pain illicited with log roll of BLEs. Strength 5/5 to BLEs. Sensation intact throughout. 2+ radial pulses.   Skin:    General: Skin is warm and dry.  Neurological:     Mental Status: She is alert.     Comments:  Follows commands however slow to react CN 3-12 grossly intact A&O x4 GCS 15 Sensation and strength intact Coordination with finger-to-nose WNL Neg pronator drift     ED Results / Procedures / Treatments   Labs (all labs ordered are listed, but only abnormal results are displayed) Labs Reviewed  URINALYSIS, ROUTINE W REFLEX MICROSCOPIC - Abnormal; Notable for the following components:      Result Value   APPearance HAZY (*)    Specific Gravity, Urine >1.030 (*)    Hgb urine dipstick TRACE (*)    Leukocytes,Ua SMALL (*)    All other components within normal limits  COMPREHENSIVE METABOLIC PANEL - Abnormal; Notable for the following components:   Glucose, Bld 217 (*)    Alkaline Phosphatase 142 (*)    All other components  within normal limits  CBC WITH DIFFERENTIAL/PLATELET - Abnormal; Notable for the following components:   Hemoglobin 11.6 (*)    Platelets 473 (*)    Abs Immature Granulocytes 0.08 (*)    All other components within normal limits  URINALYSIS, MICROSCOPIC (REFLEX) - Abnormal; Notable for the following components:   Bacteria, UA MANY (*)    All other components within normal limits  URINE CULTURE  TROPONIN I (HIGH SENSITIVITY)  TROPONIN I (HIGH SENSITIVITY)    EKG EKG Interpretation  Date/Time:  Friday December 01 2019 10:10:26 EDT Ventricular Rate:  61 PR Interval:    QRS Duration: 104 QT Interval:  464 QTC Calculation: 468 R Axis:   -27 Text Interpretation: Sinus rhythm Atrial premature complexes Left ventricular hypertrophy Anterior Q waves, possibly due to LVH Confirmed by Virgel Manifold [phone]) on 12/01/2019 11:11:58 AM   Radiology CT Head Wo Contrast  Result Date: 12/01/2019 CLINICAL DATA:  Mental status change EXAM: CT HEAD WITHOUT CONTRAST TECHNIQUE: Contiguous axial images were obtained from the base of the skull through the vertex without intravenous contrast. COMPARISON:  CT head 10/08/2019 FINDINGS: Brain: Mild to moderate atrophy. Negative  for hydrocephalus. Hypodensity in the cerebral white matter bilaterally is unchanged and consistent with chronic microvascular ischemia. Negative for acute infarct, hemorrhage, mass. Vascular: Negative for hyperdense vessel Skull: Negative Sinuses/Orbits: Paranasal sinuses clear. Bilateral cataract extraction. No orbital mass. Other: None IMPRESSION: Atrophy and chronic microvascular ischemic change. No acute abnormality and no change from the prior study Electronically Signed   By: Franchot Gallo M.D.   On: 12/01/2019 11:29   CT Lumbar Spine Wo Contrast  Result Date: 12/01/2019 CLINICAL DATA:  Low back pain.  Increased fracture risk. EXAM: CT LUMBAR SPINE WITHOUT CONTRAST TECHNIQUE: Multidetector CT imaging of the lumbar spine was performed without intravenous contrast administration. Multiplanar CT image reconstructions were also generated. COMPARISON:  Lumbar spine radiographs 11/27/2019 FINDINGS: Segmentation: 5 non-rib-bearing lumbar segments. The lowest is L5 which is partially incorporated into the sacrum. Alignment: Normal Vertebrae: Negative for fracture or mass. Hemangioma L1 vertebral body on the left. Discogenic cystic endplate changes on the left at L4-5 Paraspinal and other soft tissues: Negative for paraspinous mass or adenopathy. Mild atherosclerotic calcification in the aorta. Disc levels: T12-L1: Negative L1-2: Negative L2-3: Mild disc degeneration and disc bulging without significant stenosis or disc protrusion L3-4: Mild disc degeneration and disc bulging. Mild facet degeneration. No significant spinal or foraminal stenosis L4-5: Moderate bulging of the disc without focal disc protrusion. Bilateral facet degeneration left greater than right. Cystic endplate changes on the left. Moderate subarticular stenosis on the left. Moderate spinal stenosis. L5-S1: Mild disc degeneration with calcification in the disc. No significant stenosis. IMPRESSION: L5 is a transitional vertebra and partially  incorporated into the sacrum Negative for fracture Moderate spinal stenosis L4-5 due to disc and facet degeneration. Moderate subarticular stenosis on the left. Electronically Signed   By: Franchot Gallo M.D.   On: 12/01/2019 11:12   CT PELVIS WO CONTRAST  Result Date: 12/01/2019 CLINICAL DATA:  Low back pain. EXAM: CT PELVIS WITHOUT CONTRAST TECHNIQUE: Multidetector CT imaging of the pelvis was performed following the standard protocol without intravenous contrast. COMPARISON:  None. FINDINGS: Bones/Joint/Cartilage No acute hip fracture or dislocation. No aggressive osseous lesion. Nondisplaced vertical fracture of the left sacral ala. Nondisplaced fracture along the inferior aspect of the right sacral ala. Nondisplaced fracture of the left L5 transverse process. Generalized osteopenia. Abnormal articulation infusion of the right L5 transverse process with  the sacrum. Normal alignment. Bilateral facet arthropathy at L4-5 and bilateral foraminal narrowing. Broad-based disc bulge L4-5. Ligaments Ligaments are suboptimally evaluated by CT. Muscles and Tendons Muscles are normal. No muscle atrophy. No intramuscular fluid collection or hematoma. Soft tissue No fluid collection or hematoma. No soft tissue mass. IMPRESSION: 1. Nondisplaced vertical fracture of the left sacral ala. Nondisplaced fracture along the inferior aspect of the right sacral ala. Electronically Signed   By: Kathreen Devoid   On: 12/01/2019 11:32    Procedures Procedures (including critical care time)  Medications Ordered in ED Medications  ondansetron (ZOFRAN-ODT) disintegrating tablet 4 mg (4 mg Oral Given 12/01/19 1006)  ondansetron (ZOFRAN) injection 4 mg (4 mg Intravenous Given 12/01/19 1031)    ED Course  I have reviewed the triage vital signs and the nursing notes.  Pertinent labs & imaging results that were available during my care of the patient were reviewed by me and considered in my medical decision making (see chart for  details).    MDM Rules/Calculators/A&P                      86 year old female presents the ED with family member today for worsening back pain that started 3 to 4 weeks ago status post mechanical fall. Had x-rays done at PCPs office including bilateral hips and L-spine this week without acute findings. Patient has slowly been declining since then and unable to ambulate as well as she normally does. Has been sitting on her couch most of the day for the past week. On arrival to the ED patient is afebrile, nontachycardic and nontachypneic. She is a frail elderly patient. She has pain to her L-spine and bilateral hips. Given she had x-rays done will obtain CT scans to further evaluate for occult fracture. Daughter mentions that patient has slowly been declining and seems a little bit more confused. She has no focal neuro deficits on exam however is slightly slow to respond. Given this will obtain CT head, awaiting urinalysis at this time. Will obtain screening labs as well.   CBC without leukocytosis. Hemoglobin stable.  CMP with elevated glucose 217. Alk phos 142 which patient has a history of. No abdominal tenderness on exam today. No other elevation in LFTs. No other electrolyte abnormalities.  Troponin of 3  CT scan of head negative.  CT scan of L-spine negative. CT of pelvis does show bilateral ala fractures. Suspect this is the cause of patient's pain.   Shared decision making with family member at bedside regarding sending patient to SNF/rehab to have physical therapy daily versus going home. It does sound like primary care physician is currently working on physical therapy at home. Daughter feels more comfortable having her at home given the current pandemic and does not want patient to be isolated without any visitors. I feel this is reasonable today. Patient has good strength in her bilateral lower extremities. It appears that she does not like taking her pain medicine, has been prescribed  tramadol by PCP. Have encouraged that they take this as needed as this will likely help. Also discharged home with Lidoderm patches.   Pt able to provide a very small amount of urine for urinalysis. She does have trace hemoglobin and small leukocytes with 6-10 white blood cells and many bacteria. Given this and increased confusion will treat for UTI. Lab did not have enough urine to obtain urine culture, in and out cath performed. Urine culture sent. Stable for discharge home at  this time. She is advised to follow-up with PCP next week regarding ED visit and new findings on CT scan. Have personally placed PT consult as well in hopes that this will get them to see patient sooner. Feel she will and affect significantly from this. Strict return precautions have been discussed with daughter. She is in agreement with plan.   This note was prepared using Dragon voice recognition software and may include unintentional dictation errors due to the inherent limitations of voice recognition software.  Final Clinical Impression(s) / ED Diagnoses Final diagnoses:  Acute bilateral low back pain without sciatica  Closed nondisplaced zone I fracture of sacrum, initial encounter [facility])  Acute cystitis with hematuria    Rx / DC Orders ED Discharge Orders         [facility] [place]     12/01/19 1213    Face-to-face encounter (required for Medicare/Medicaid patients)    Comments: I Eustaquio Maize certify that this patient is under my care and that I, or a nurse practitioner or physician's assistant working with me, had a face-to-face encounter that meets the physician face-to-face encounter requirements with this patient on 12/01/2019. The encounter with the patient was in whole, or in part for the following medical condition(s) which is the primary reason for home health care (List medical condition): bilateral ala fx s/2 fall   12/01/19 1213    cephALEXin (KEFLEX) 500 MG capsule  2 times daily     12/01/19  1446           Discharge Instructions     Your CT scan showed a bilateral fracture of your sacral bone likely from the fall 3-4 weeks ago. I have placed an ambulatory referral to physical therapy and it sounds as if your PCP is working on physical therapy in the home as well.  Please follow up with your PCP regarding your ED visit today. Weight bare as tolerated and take your pain medication as needed at home.  Your urine did appear infectious today - please pick up antibiotics and take as prescribed. We have sent the urine for culture and will call in 2-3 days IF the antibiotic needs to be changed Return to the ED IMMEDIATELY for any worsening symptoms including worsening back pain, inability to ambulate at home, fevers > 100.4, abdominal pain, or any other concerning symptoms        Eustaquio Maize, PA-C 12/01/19 1454    Virgel Manifold, MD 12/02/19 1003

## 2019-12-01 NOTE — ED Notes (Signed)
Pt unable to give urine at this time, RN Sam inform.

## 2019-12-01 NOTE — ED Notes (Signed)
Pt vomited x1.  

## 2019-12-02 LAB — URINE CULTURE: Culture: NO GROWTH

## 2020-12-30 IMAGING — CT CT HEAD W/O CM
3 series · 15 of 47 positions shown, 18 images · non-contrast
Comparison: CT head 10/08/2019

CLINICAL DATA: Mental status change

EXAM:
CT HEAD WITHOUT CONTRAST
TECHNIQUE: Contiguous axial images were obtained from the base of the skull
through the vertex without intravenous contrast.

[Series 3: head wo · axial · 0.41mm/px · z∈[+798,+923]mm · 9 of 30 slices shown, 12 images]
[im 3/30  brain]
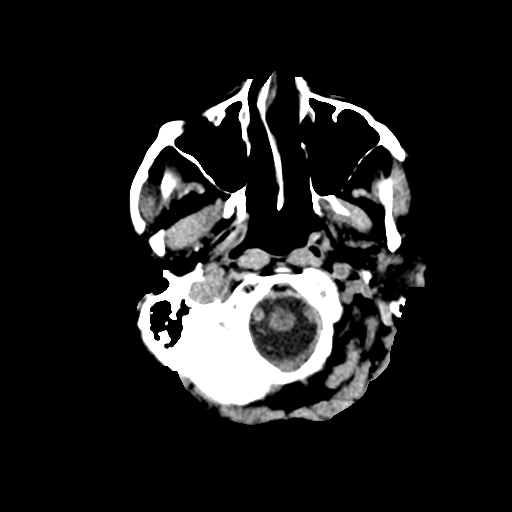
[im 3/30  bone]
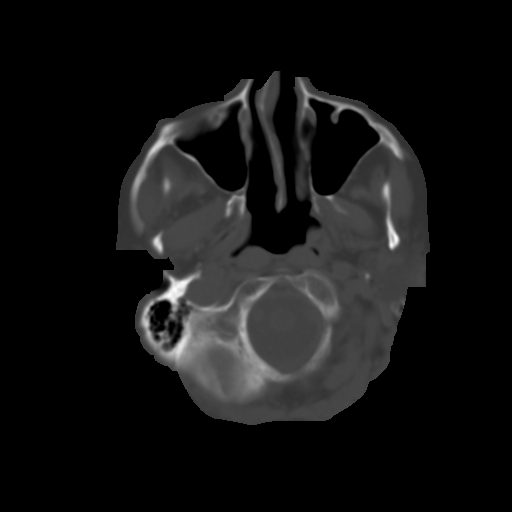
[im 6/30  brain]
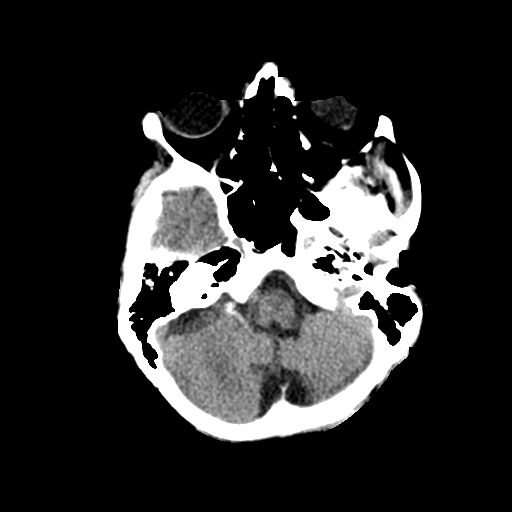
[im 9/30  brain]
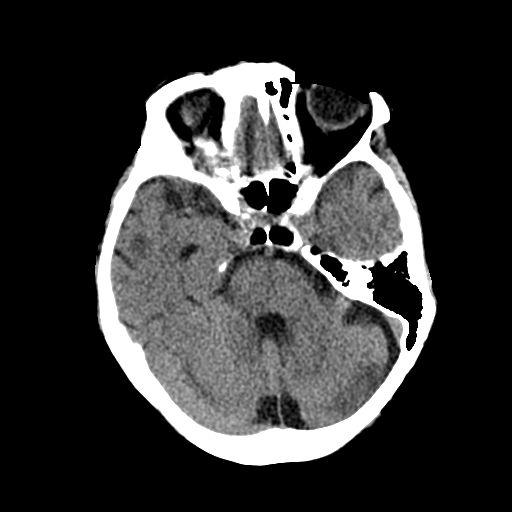
[im 12/30  brain]
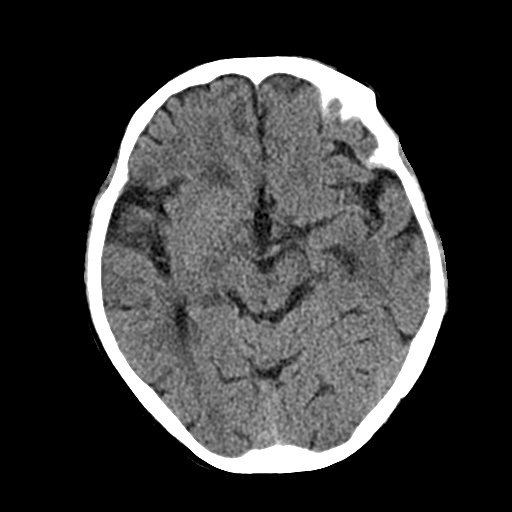
[im 16/30  brain]
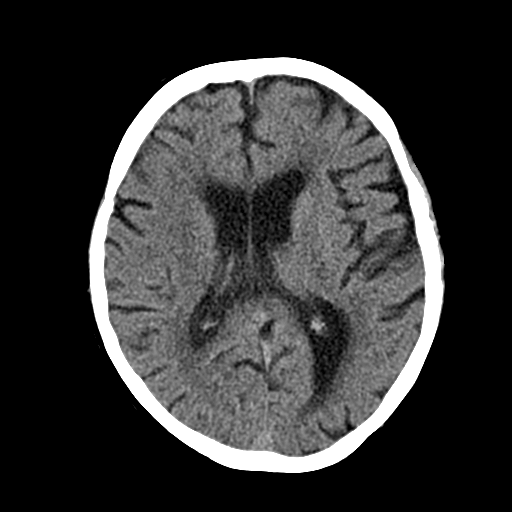
[im 16/30  bone]
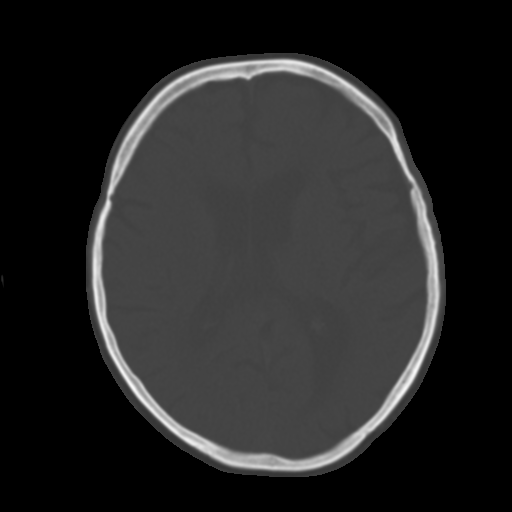
[im 19/30  brain]
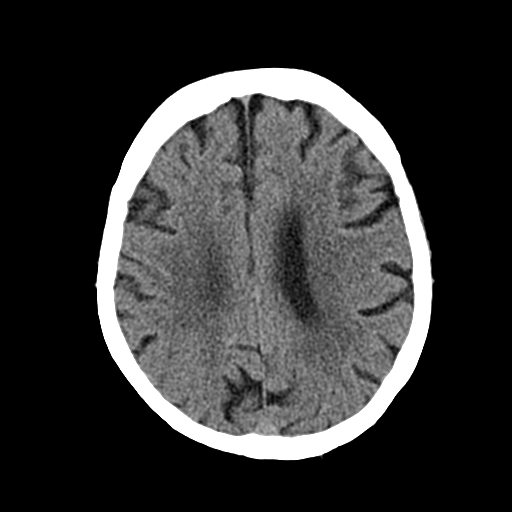
[im 22/30  brain]
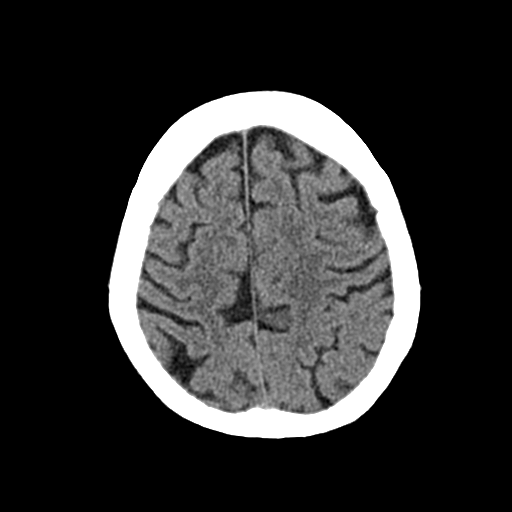
[im 25/30  brain]
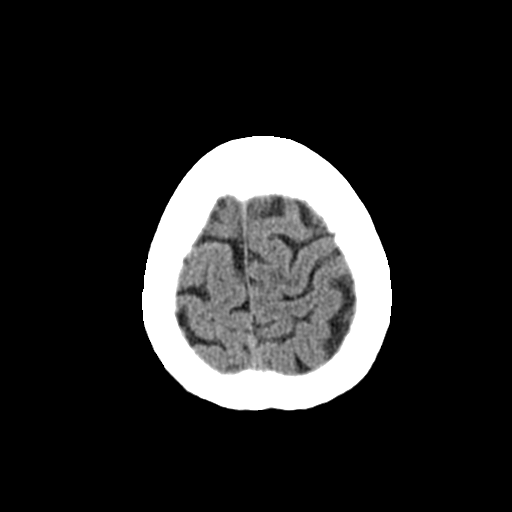
[im 28/30  brain]
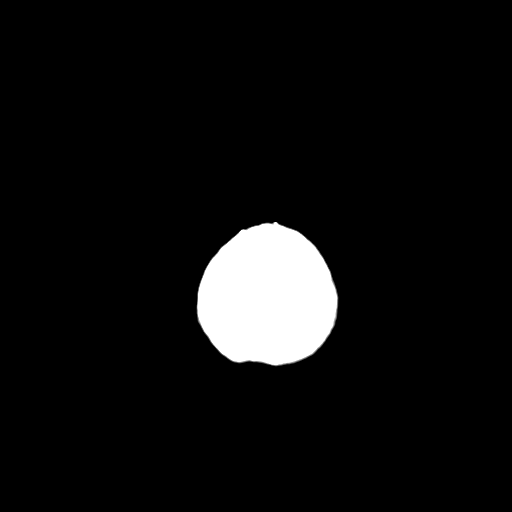
[im 28/30  bone]
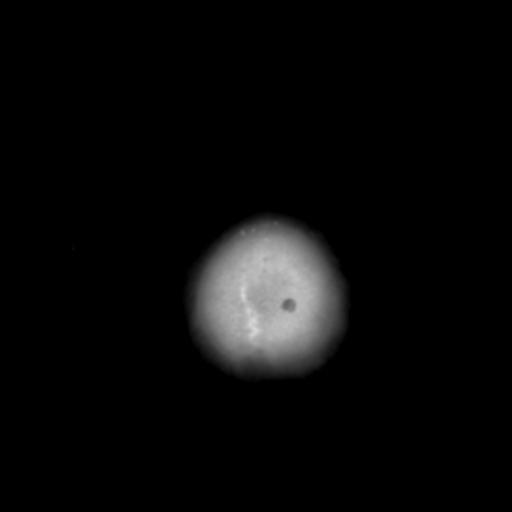

[Series 4: cor soft · coronal · 0.29mm/px · 3 of 60 slices shown]
[im 20/60  brain]
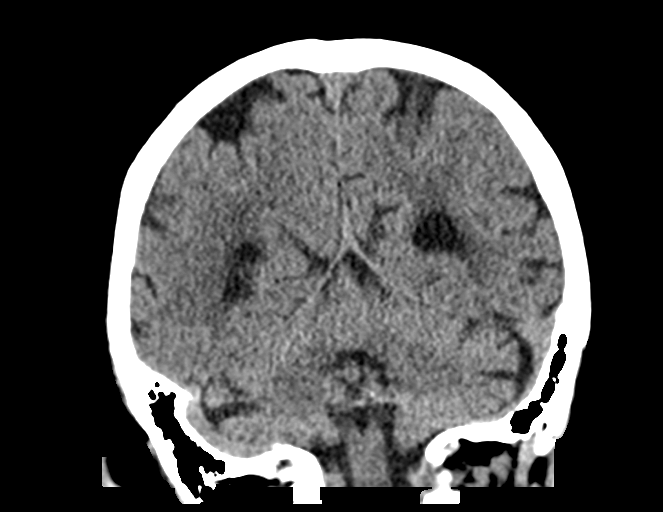
[im 27/60  brain]
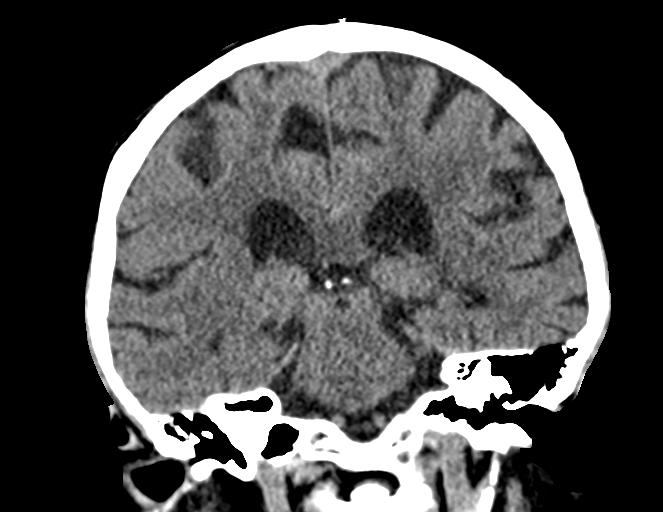
[im 33/60  brain]
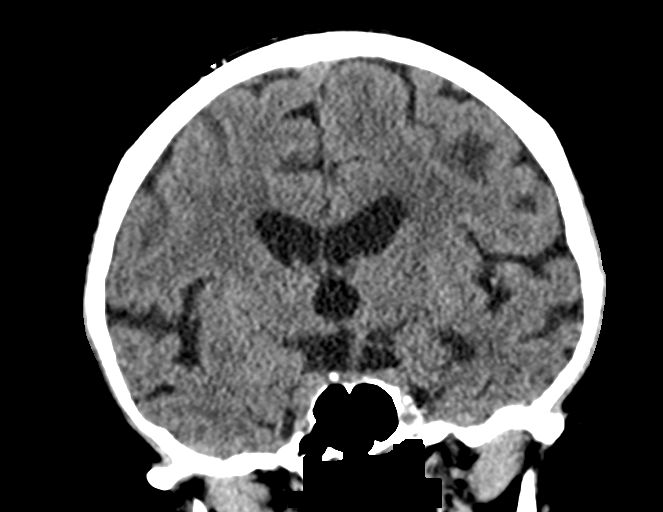

[Series 5: sag soft · sagittal · 0.30mm/px · 3 of 51 slices shown]
[im 17/51  brain]
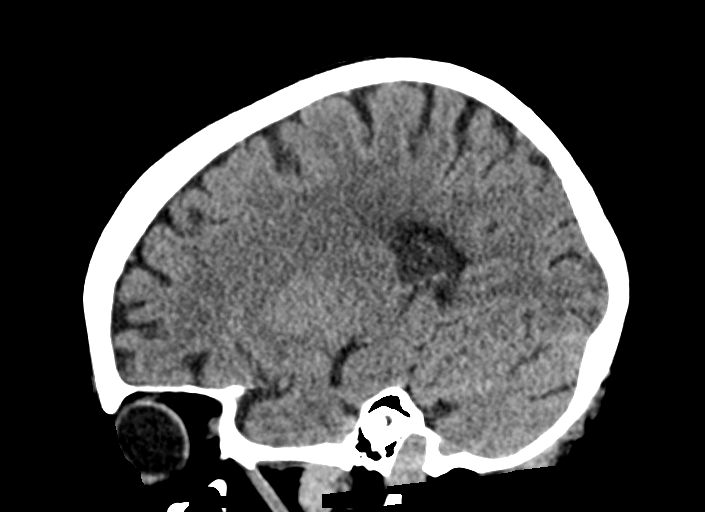
[im 26/51  brain]
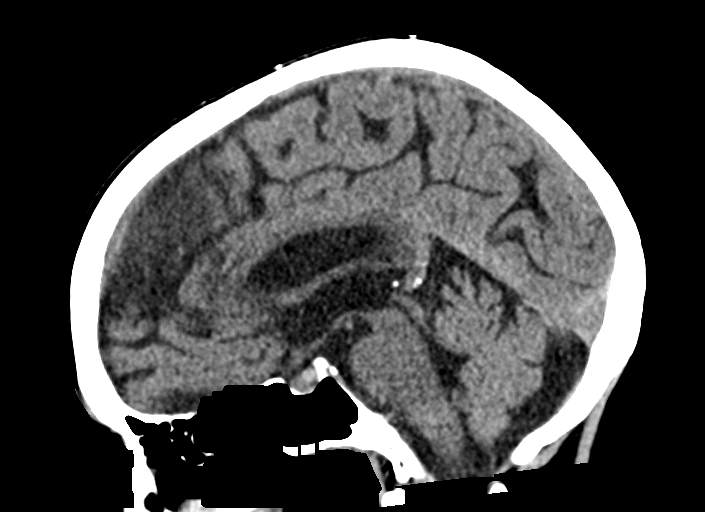
[im 34/51  brain]
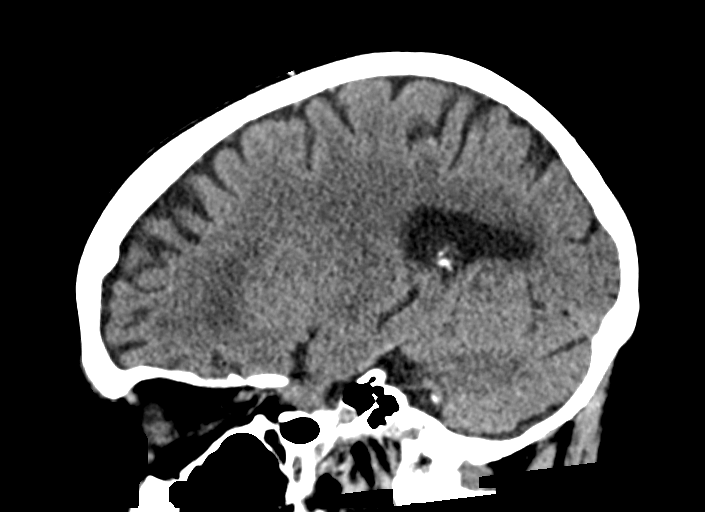

[15 of 47 positions shown; findings below may reference images not displayed]

FINDINGS: Brain: Mild to moderate atrophy. Negative for hydrocephalus.
Hypodensity in the cerebral white matter bilaterally is unchanged
and consistent with chronic microvascular ischemia. Negative for
acute infarct, hemorrhage, mass.

Vascular: Negative for hyperdense vessel

Skull: Negative

Sinuses/Orbits: Paranasal sinuses clear. Bilateral cataract
extraction. No orbital mass.

Other: None
IMPRESSION: Atrophy and chronic microvascular ischemic change. No acute
abnormality and no change from the prior study
